# Patient Record
Sex: Female | Born: 1995 | Race: Black or African American | Hispanic: No | Marital: Single | Smoking: Never smoker
Health system: Southern US, Community
[De-identification: ages and names within clinical notes are randomized; demographics above are authoritative.]

## PROBLEM LIST (undated history)

## (undated) ENCOUNTER — Inpatient Hospital Stay (HOSPITAL_COMMUNITY): Payer: Self-pay

## (undated) DIAGNOSIS — Z202 Contact with and (suspected) exposure to infections with a predominantly sexual mode of transmission: Secondary | ICD-10-CM

## (undated) DIAGNOSIS — D649 Anemia, unspecified: Secondary | ICD-10-CM

## (undated) HISTORY — PX: NO PAST SURGERIES: SHX2092

---
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
## 2016-09-22 ENCOUNTER — Encounter (HOSPITAL_COMMUNITY): Payer: Self-pay

## 2016-11-16 IMAGING — US US OB TRANSVAGINAL
1 series · 15 of 27 positions shown · non-contrast
Comparison: None.

CLINICAL DATA: Pregnant patient in first-trimester pregnancy with
abdominal pain.

EXAM:
OBSTETRIC <14 WK US AND TRANSVAGINAL OB US
TECHNIQUE: Both transabdominal and transvaginal ultrasound examinations were
performed for complete evaluation of the gestation as well as the
maternal uterus, adnexal regions, and pelvic cul-de-sac.
Transvaginal technique was performed to assess early pregnancy.

[Series 1: us ob transvaginal · 15 of 27 slices shown]
[im 1/27]
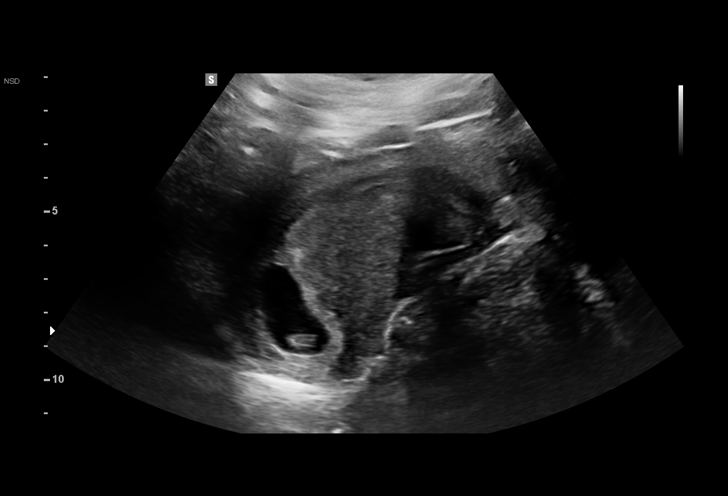
[im 3/27]
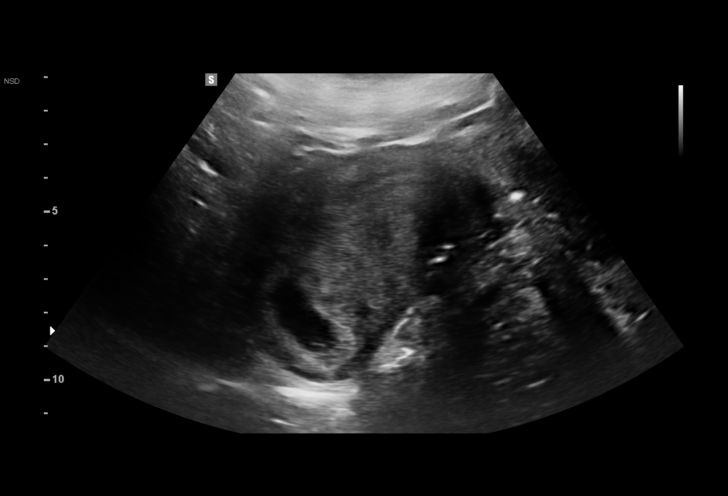
[im 5/27]
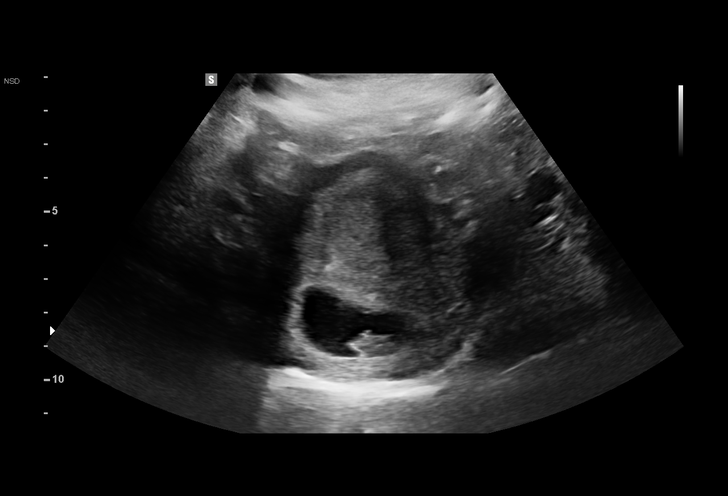
[im 7/27]
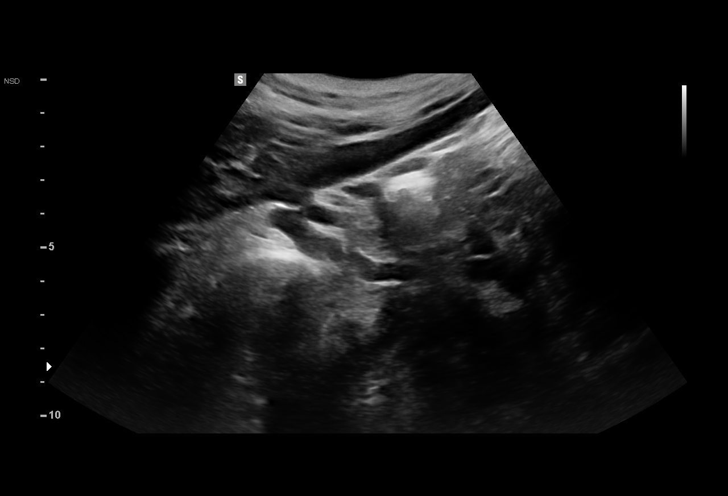
[im 9/27]
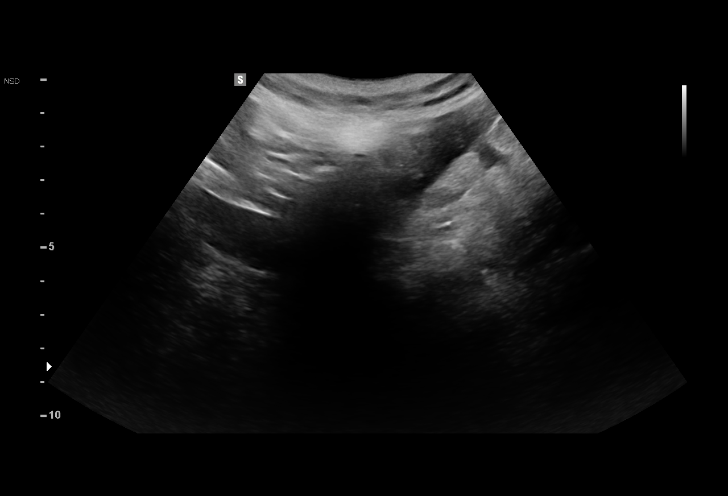
[im 10/27]
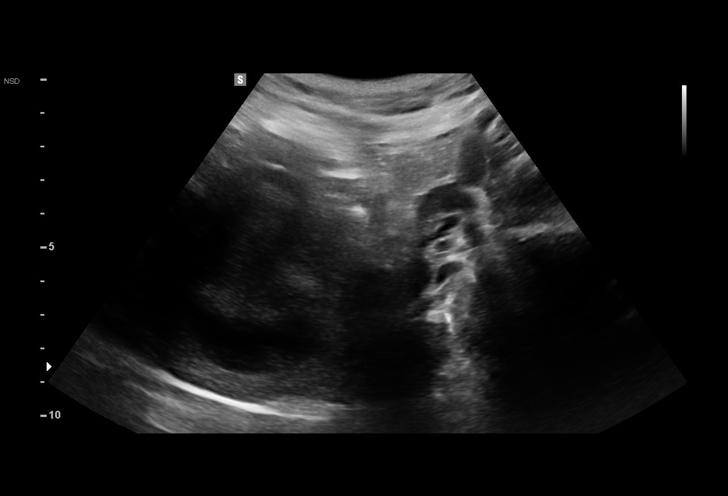
[im 12/27]
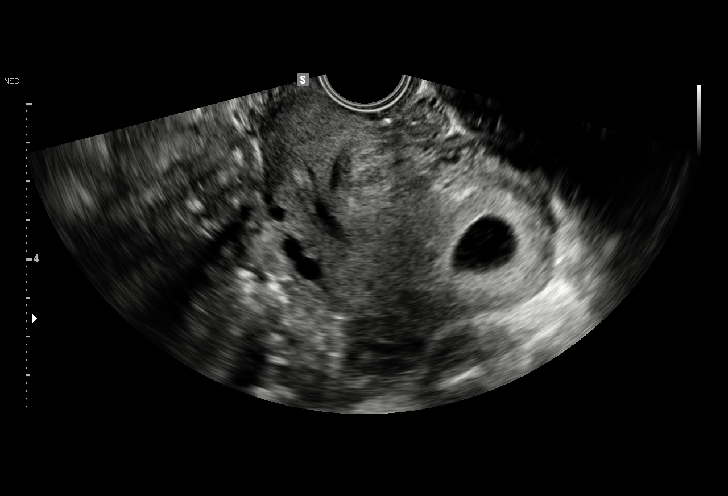
[im 14/27]
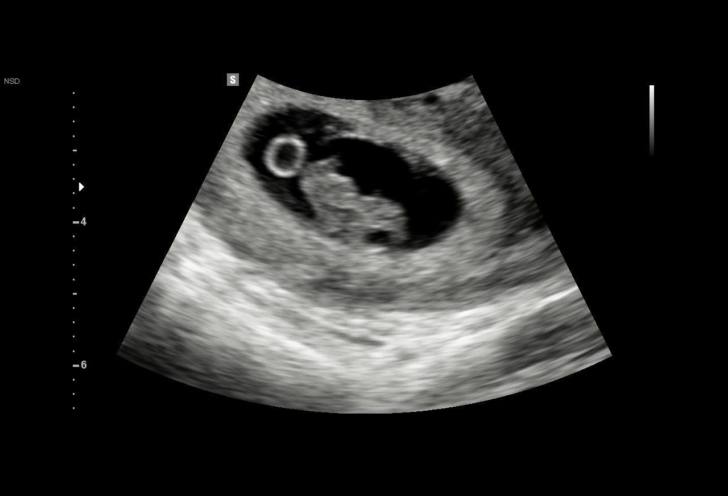
[im 16/27]
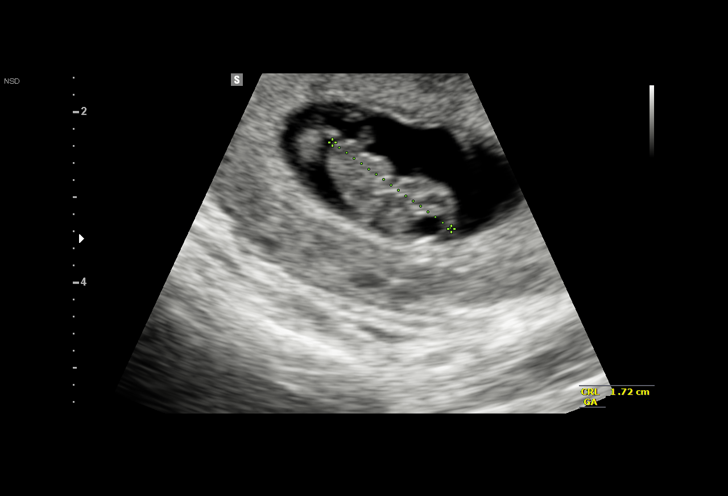
[im 18/27]
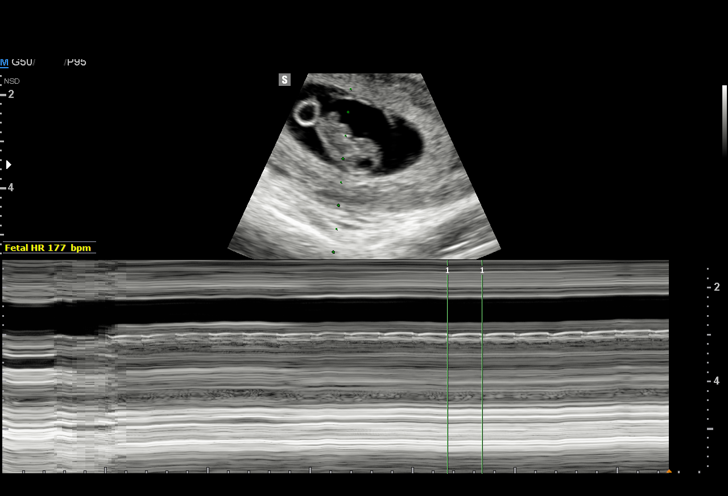
[im 19/27]
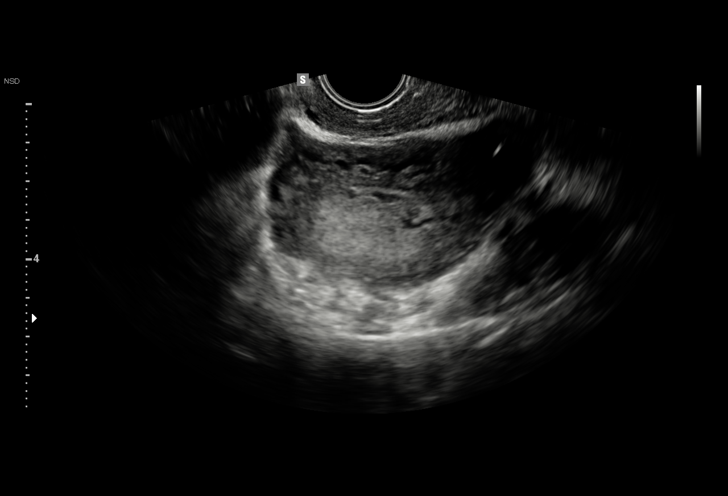
[im 21/27]
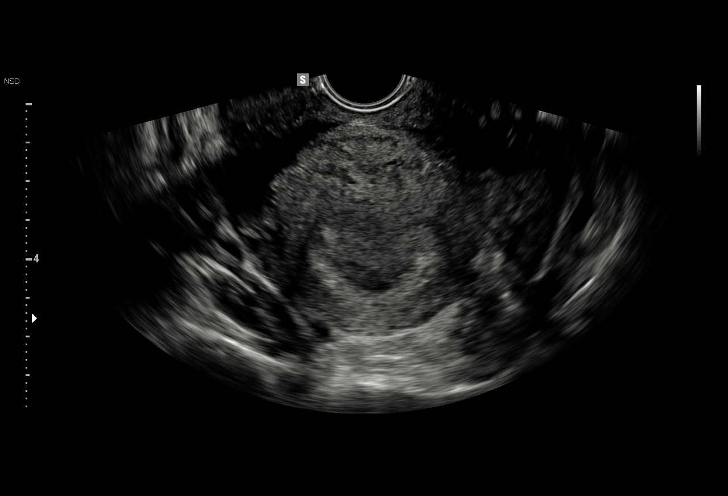
[im 23/27]
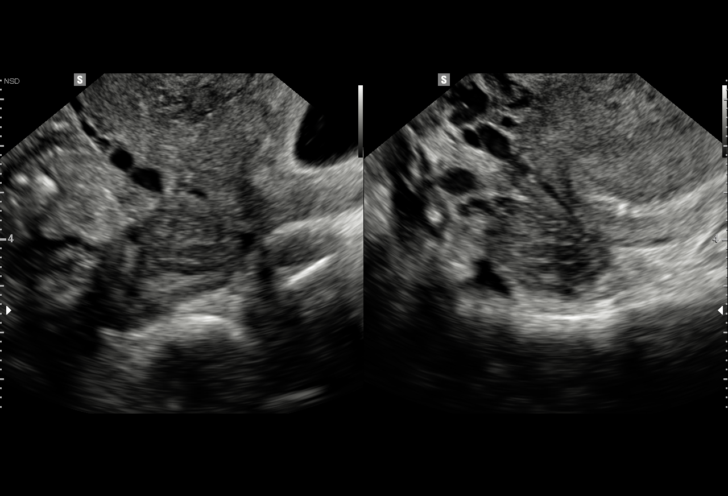
[im 25/27]
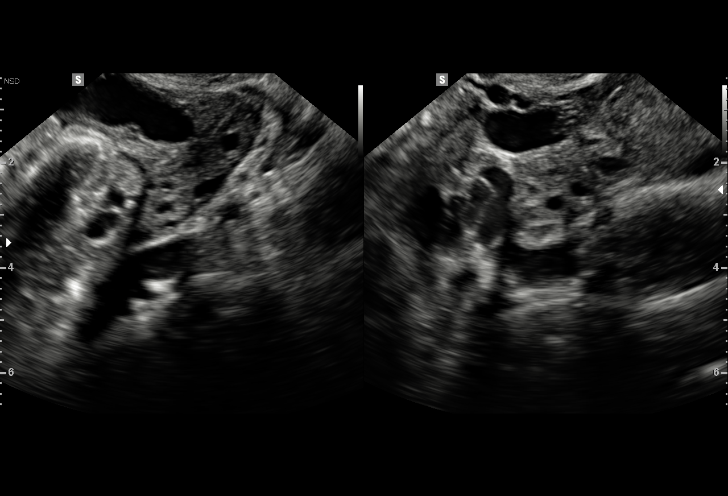
[im 27/27]
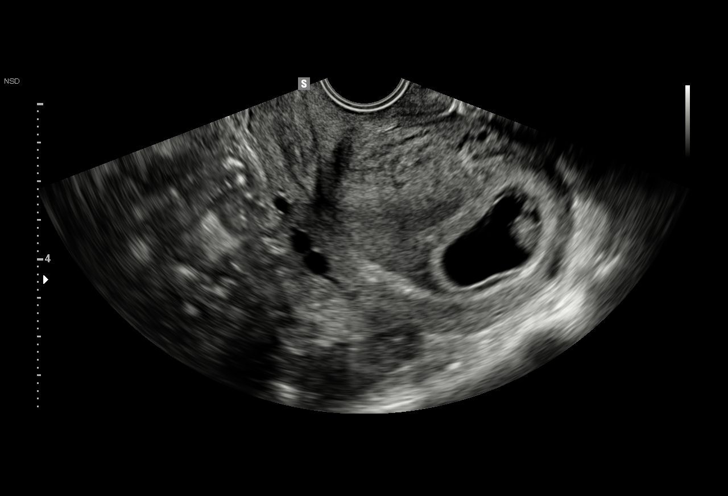

[15 of 27 positions shown; findings below may reference images not displayed]

FINDINGS: Intrauterine gestational sac: Present.

Yolk sac:  Present.

Embryo:  Present.

Cardiac Activity: Present.

Heart Rate: 177  bpm

CRL:  17.2  mm   8 w   1 d                  US EDC: 06/28/2016

Subchorionic hemorrhage:  None visualized.

Maternal uterus/adnexae: The uterus is retroverted. Both ovaries are
normal. No pelvic free fluid.
IMPRESSION: Single live intrauterine pregnancy estimated gestational age 8 weeks
1 day for estimated date of delivery 06/28/2016. No subchorionic
hemorrhage.
